# Patient Record
Sex: Female | Born: 1980 | Race: White | Hispanic: No | Marital: Single | Smoking: Current every day smoker
Health system: Southern US, Community
[De-identification: ages and names within clinical notes are randomized; demographics above are authoritative.]

---

## 2008-06-27 ENCOUNTER — Inpatient Hospital Stay: Payer: Self-pay

## 2009-05-22 ENCOUNTER — Emergency Department: Payer: Self-pay | Admitting: Emergency Medicine

## 2011-08-28 ENCOUNTER — Emergency Department: Payer: Self-pay | Admitting: Unknown Physician Specialty

## 2011-09-10 ENCOUNTER — Inpatient Hospital Stay: Payer: Self-pay | Admitting: Internal Medicine

## 2011-09-10 LAB — BASIC METABOLIC PANEL
BUN: 6 mg/dL — ABNORMAL LOW (ref 7–18)
Chloride: 108 mmol/L — ABNORMAL HIGH (ref 98–107)
Creatinine: 0.65 mg/dL (ref 0.60–1.30)
Glucose: 116 mg/dL — ABNORMAL HIGH (ref 65–99)
Osmolality: 276 (ref 275–301)
Potassium: 3.5 mmol/L (ref 3.5–5.1)
Sodium: 139 mmol/L (ref 136–145)

## 2011-09-10 LAB — CBC
HGB: 12.6 g/dL (ref 12.0–16.0)
MCH: 29.7 pg (ref 26.0–34.0)
MCHC: 33.6 g/dL (ref 32.0–36.0)
MCV: 88 fL (ref 80–100)
Platelet: 275 10*3/uL (ref 150–440)
RBC: 4.24 10*6/uL (ref 3.80–5.20)
WBC: 16.4 10*3/uL — ABNORMAL HIGH (ref 3.6–11.0)

## 2011-09-10 LAB — HCG, QUANTITATIVE, PREGNANCY: Beta Hcg, Quant.: <1 m[IU]/mL — ABNORMAL LOW

## 2011-09-10 LAB — TROPONIN I: Troponin-I: <0.02 ng/mL

## 2011-09-11 LAB — BASIC METABOLIC PANEL
Anion Gap: 10 (ref 7–16)
BUN: 8 mg/dL (ref 7–18)
Chloride: 108 mmol/L — ABNORMAL HIGH (ref 98–107)
Co2: 25 mmol/L (ref 21–32)
Creatinine: 0.62 mg/dL (ref 0.60–1.30)
EGFR (Non-African Amer.): >60
Osmolality: 283 (ref 275–301)
Potassium: 3.6 mmol/L (ref 3.5–5.1)
Sodium: 143 mmol/L (ref 136–145)

## 2011-09-11 LAB — CBC WITH DIFFERENTIAL/PLATELET
Eosinophil %: 1 %
HCT: 35.2 % (ref 35.0–47.0)
HGB: 11.6 g/dL — ABNORMAL LOW (ref 12.0–16.0)
Lymphocyte #: 3.5 10*3/uL (ref 1.0–3.6)
Lymphocyte %: 32.1 %
MCHC: 33 g/dL (ref 32.0–36.0)
MCV: 90 fL (ref 80–100)
Monocyte %: 4.2 %
Neutrophil #: 6.7 10*3/uL — ABNORMAL HIGH (ref 1.4–6.5)
Neutrophil %: 62.5 %
Platelet: 234 10*3/uL (ref 150–440)
RBC: 3.93 10*6/uL (ref 3.80–5.20)
WBC: 10.8 10*3/uL (ref 3.6–11.0)

## 2011-09-11 LAB — MAGNESIUM: Magnesium: 1.8 mg/dL

## 2011-09-15 LAB — CULTURE, BLOOD (SINGLE)

## 2011-09-16 ENCOUNTER — Inpatient Hospital Stay: Payer: Self-pay | Admitting: Internal Medicine

## 2011-09-16 LAB — CK TOTAL AND CKMB (NOT AT ARMC)
CK, Total: 57 U/L (ref 21–215)
CK-MB: <0.5 ng/mL — ABNORMAL LOW (ref 0.5–3.6)

## 2011-09-16 LAB — COMPREHENSIVE METABOLIC PANEL
Albumin: 3.4 g/dL (ref 3.4–5.0)
BUN: 8 mg/dL (ref 7–18)
Chloride: 103 mmol/L (ref 98–107)
Co2: 19 mmol/L — ABNORMAL LOW (ref 21–32)
Creatinine: 0.68 mg/dL (ref 0.60–1.30)
EGFR (African American): >60
EGFR (Non-African Amer.): >60
Glucose: 96 mg/dL (ref 65–99)
Osmolality: 266 (ref 275–301)
SGOT(AST): 17 U/L (ref 15–37)
SGPT (ALT): 43 U/L

## 2011-09-16 LAB — CBC
HCT: 40.9 % (ref 35.0–47.0)
MCH: 29.9 pg (ref 26.0–34.0)
MCHC: 34.2 g/dL (ref 32.0–36.0)
MCV: 87 fL (ref 80–100)
Platelet: 282 10*3/uL (ref 150–440)
RDW: 12.4 % (ref 11.5–14.5)
WBC: 22.5 10*3/uL — ABNORMAL HIGH (ref 3.6–11.0)

## 2011-09-16 LAB — PREGNANCY, URINE: Pregnancy Test, Urine: NEGATIVE m[IU]/mL

## 2011-09-17 LAB — CBC WITH DIFFERENTIAL/PLATELET
Basophil #: 0 10*3/uL (ref 0.0–0.1)
Eosinophil %: 0.2 %
HCT: 38.2 % (ref 35.0–47.0)
HGB: 13.3 g/dL (ref 12.0–16.0)
Lymphocyte #: 0.8 10*3/uL — ABNORMAL LOW (ref 1.0–3.6)
Lymphocyte %: 7.2 %
MCHC: 34.9 g/dL (ref 32.0–36.0)
MCV: 89 fL (ref 80–100)
Monocyte #: 0.1 10*3/uL (ref 0.0–0.7)
Monocyte %: 0.5 %
Neutrophil #: 10.3 10*3/uL — ABNORMAL HIGH (ref 1.4–6.5)
Neutrophil %: 91.8 %
RBC: 4.32 10*6/uL (ref 3.80–5.20)
WBC: 11.2 10*3/uL — ABNORMAL HIGH (ref 3.6–11.0)

## 2011-09-17 LAB — BASIC METABOLIC PANEL
Anion Gap: 11 (ref 7–16)
BUN: 10 mg/dL (ref 7–18)
Calcium, Total: 8.9 mg/dL (ref 8.5–10.1)
EGFR (African American): >60
EGFR (Non-African Amer.): >60
Glucose: 120 mg/dL — ABNORMAL HIGH (ref 65–99)
Osmolality: 280 (ref 275–301)
Potassium: 4.5 mmol/L (ref 3.5–5.1)

## 2011-09-17 LAB — SEDIMENTATION RATE: Erythrocyte Sed Rate: 39 mm/hr — ABNORMAL HIGH (ref 0–20)

## 2011-10-03 ENCOUNTER — Emergency Department: Payer: Self-pay | Admitting: Emergency Medicine

## 2011-10-11 ENCOUNTER — Emergency Department: Payer: Self-pay | Admitting: *Deleted

## 2011-10-11 LAB — CBC
MCH: 28.9 pg (ref 26.0–34.0)
MCHC: 34 g/dL (ref 32.0–36.0)
Platelet: 250 10*3/uL (ref 150–440)
RBC: 4.84 10*6/uL (ref 3.80–5.20)
RDW: 12.9 % (ref 11.5–14.5)

## 2011-10-11 LAB — COMPREHENSIVE METABOLIC PANEL
Albumin: 3.5 g/dL (ref 3.4–5.0)
Anion Gap: 12 (ref 7–16)
BUN: 12 mg/dL (ref 7–18)
Calcium, Total: 9.1 mg/dL (ref 8.5–10.1)
Chloride: 103 mmol/L (ref 98–107)
EGFR (African American): >60
Glucose: 92 mg/dL (ref 65–99)
Potassium: 3.5 mmol/L (ref 3.5–5.1)
SGOT(AST): 21 U/L (ref 15–37)
SGPT (ALT): 29 U/L
Total Protein: 8.5 g/dL — ABNORMAL HIGH (ref 6.4–8.2)

## 2012-12-12 IMAGING — CT CT CHEST W/ CM
1 series · 15 of 34 positions shown, 19 images · non-contrast
Comparison: none

REASON FOR EXAM: dyspnea, hypoxia, tachycardia, elevated D-dimer
COMMENTS:

[Series 4: soft tissue · axial · 0.70mm/px · z∈[-332,-71]mm · 15 of 103 slices shown, 19 images]
[im 8/103  mediastinal]
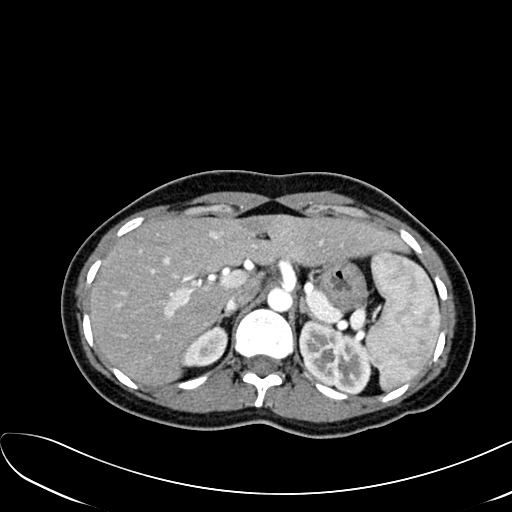
[im 8/103  lung]
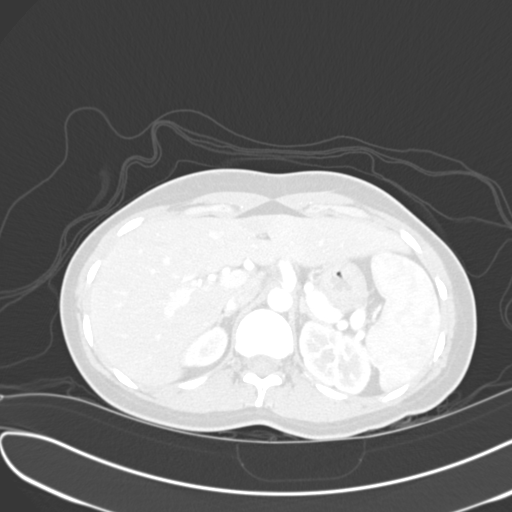
[im 16/103  lung]
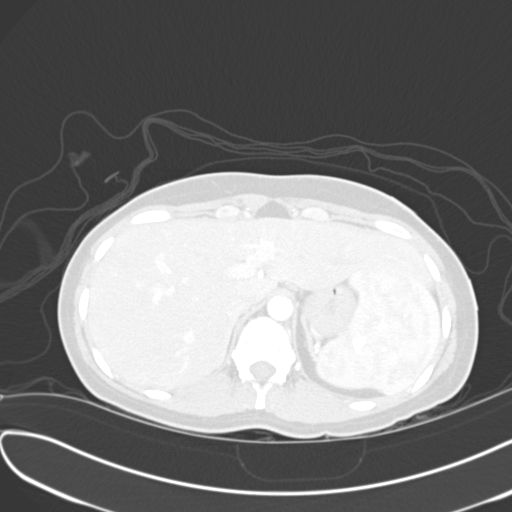
[im 21/103  lung]
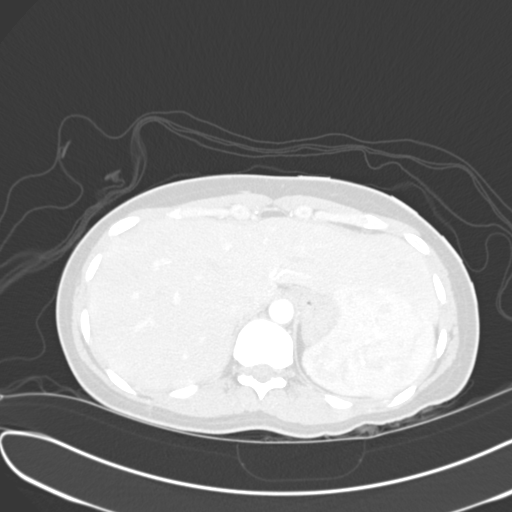
[im 27/103  lung]
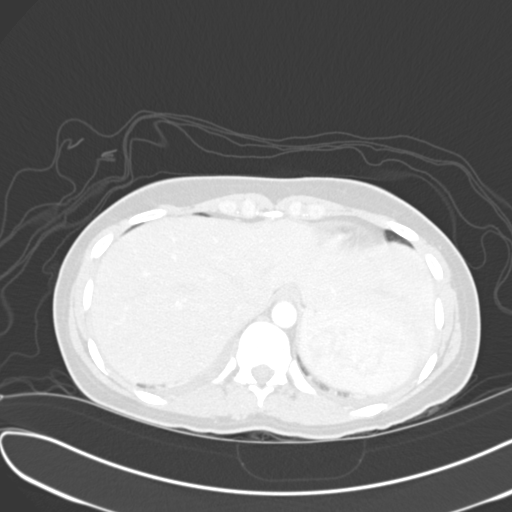
[im 35/103  mediastinal]
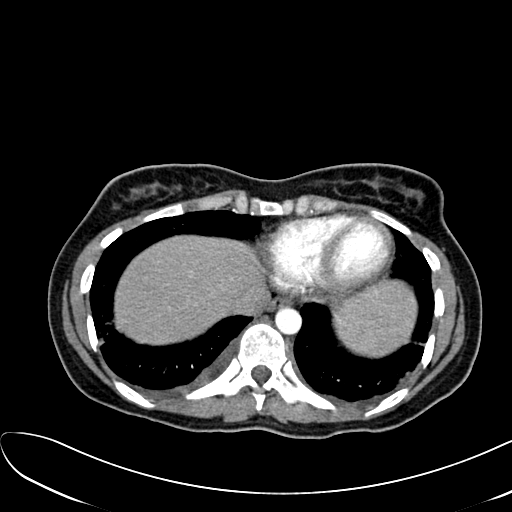
[im 35/103  lung]
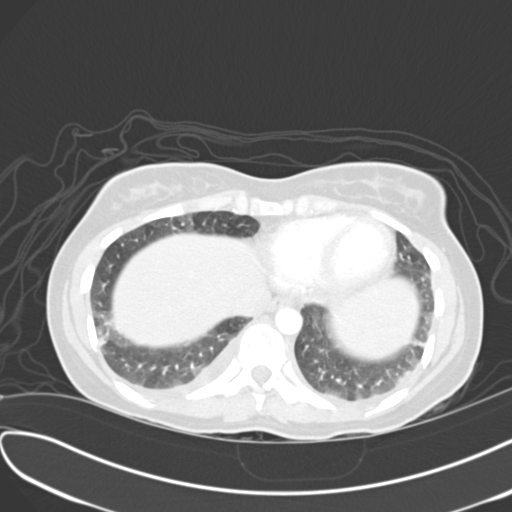
[im 41/103  lung]
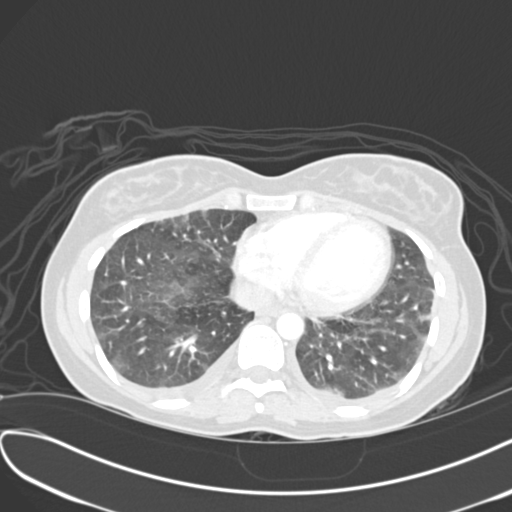
[im 46/103  lung]
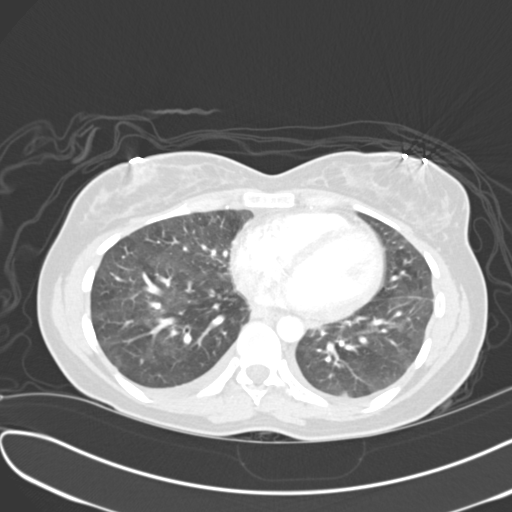
[im 53/103  lung]
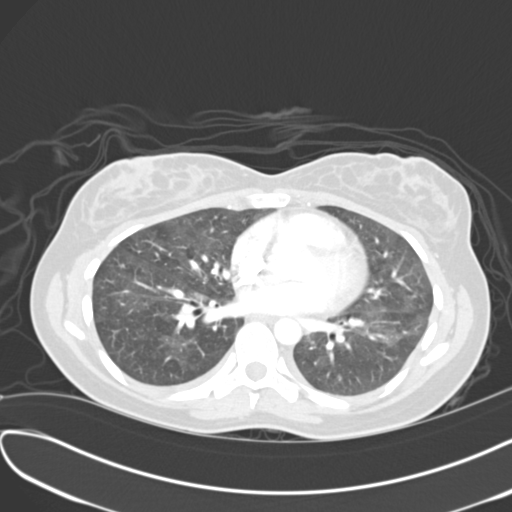
[im 57/103  mediastinal]
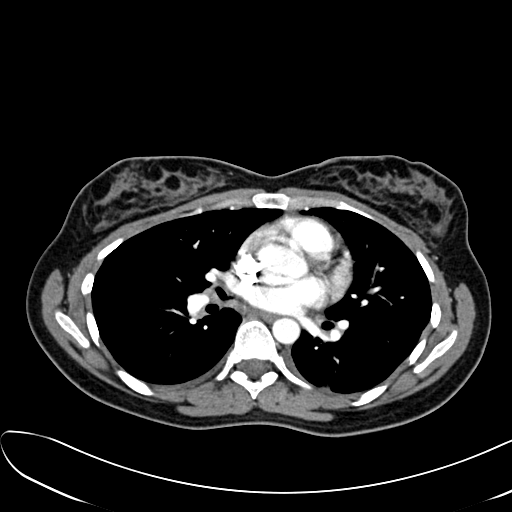
[im 57/103  lung]
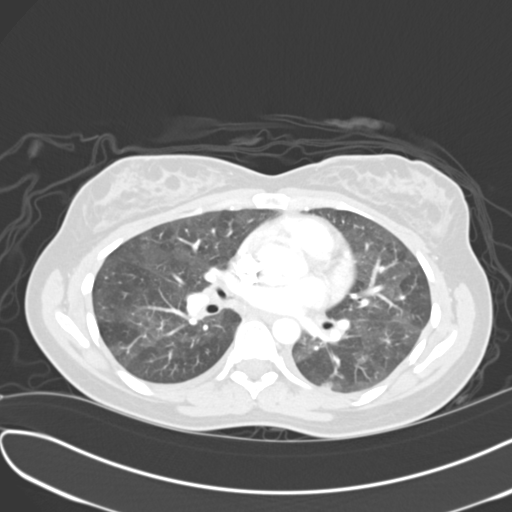
[im 62/103  lung]
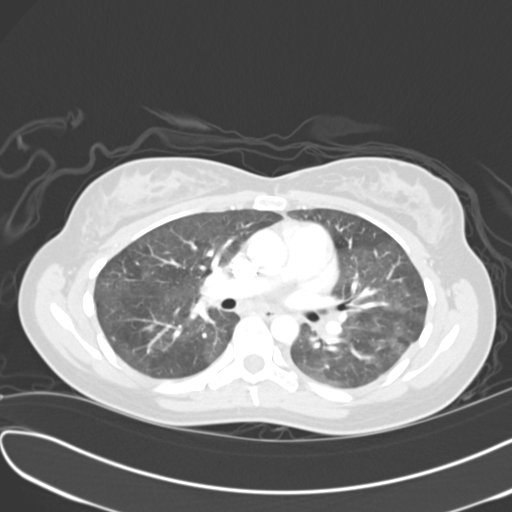
[im 69/103  lung]
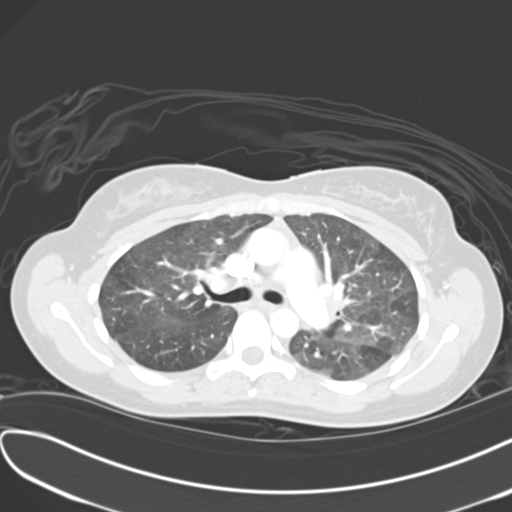
[im 76/103  lung]
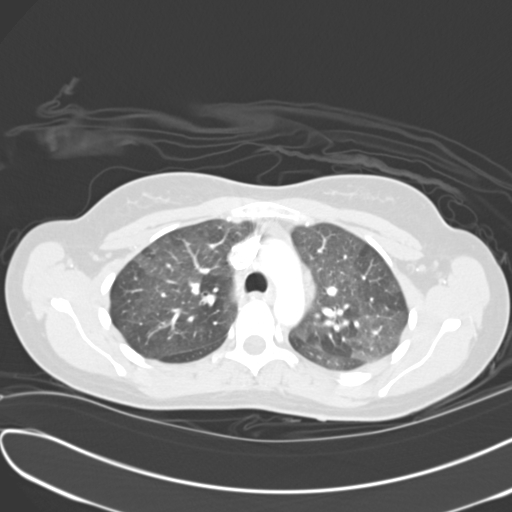
[im 82/103  mediastinal]
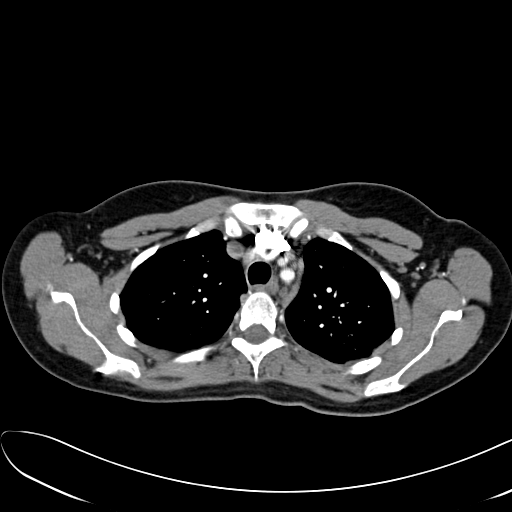
[im 82/103  lung]
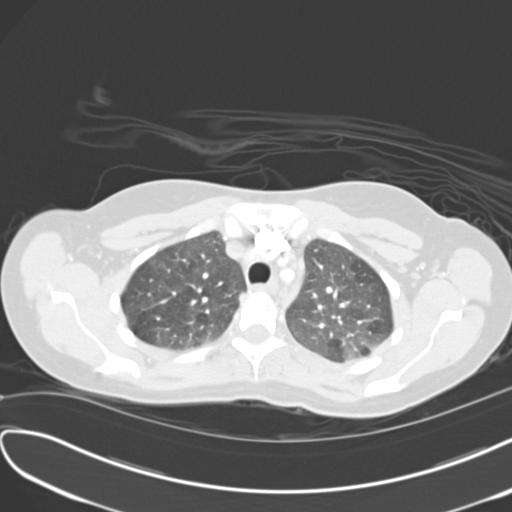
[im 87/103  lung]
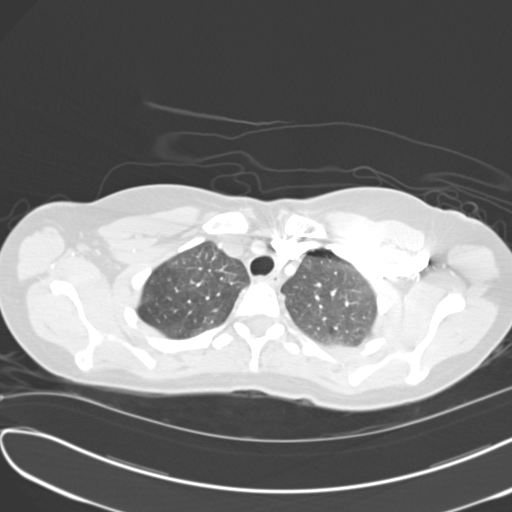
[im 95/103  lung]
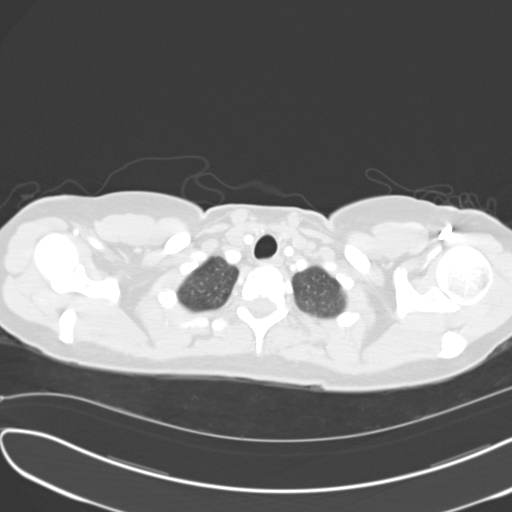

[15 of 34 positions shown; findings below may reference images not displayed]

PROCEDURE:     CT  - CT CHEST (FOR PE) W  - September 16, 2011 [DATE]

RESULT:     CT of the chest is performed utilizing 85 mL of Hsovue-VKT
iodinated intravenous contrast and oral contrast. Images are reconstructed
in the axial plane at 3.0 mm slice thickness. The patient has a previous
exam for from 10 September, 2011 for comparison.

No filling defect is seen within the pulmonary arteries to suggest pulmonary
embolism. Trace bilateral pleural effusions and bilateral lower lobe areas
of pneumonia versus atelectasis are present. There is progressive ground
glass attenuation in the lungs suggestive of pneumonitis. No endobronchial
lesion is evident. No discrete pulmonary parenchymal mass is appreciated.
Areas of subsegmental subpleural consolidation suggest minimal infiltrate
versus atelectasis in the lower lobes. Pulmonology consultation and followup
is recommended.

The thoracic aortic size is normal without evidence of dissection. No
pericardial effusion is seen. The upper abdominal structures appear within
normal limits for the phase of enhancement.
IMPRESSION: 1. Changes of pneumonitis with basilar pneumonia versus atelectasis. The
groundglass areas of attenuation have progressed since the previous study.
No evidence of pulmonary embolism. Trace pleural effusions. Pulmonology
consultation may be beneficial.

## 2013-01-07 IMAGING — CT CT CHEST W/ CM
1 series · 15 of 32 positions shown, 19 images · IV contrast (APPLIED)
Comparison: None

REASON FOR EXAM: tachycardia, hypoxia, recent hospital stay
COMMENTS:

PROCEDURE:     CT  - CT CHEST (FOR PE) W  - October 12, 2011  [DATE]
RESULT:     Indications: Shortness of breath
TECHNIQUE: A thin-section spiral CT from the lung apices to the upper
abdomen was acquired on a multi slice scanner following 100ml Wsovue-RAK
intravenous contrast. These images were then transferred to the Siemens work
station and were subsequently reviewed utilizing 3-D reconstructions and MIP
images.

[Series 4: soft tissue · axial · 0.60mm/px · z∈[-346,-64]mm · 15 of 105 slices shown, 19 images]
[im 7/105  soft-tissue]
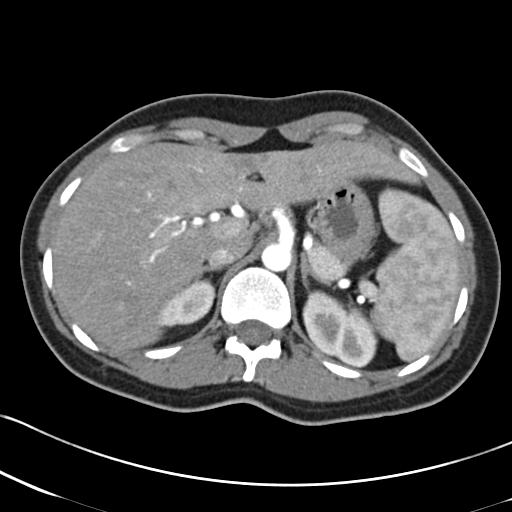
[im 7/105  bone]
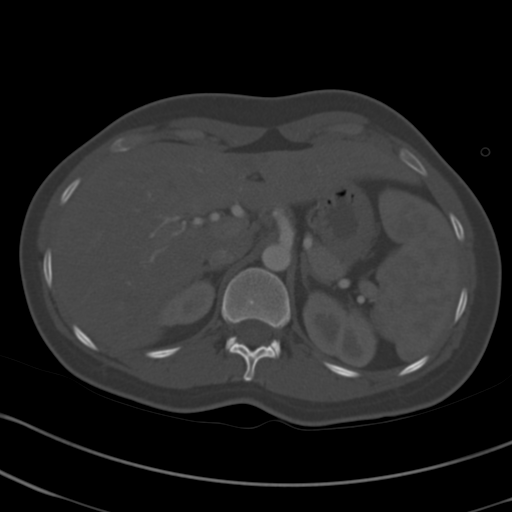
[im 14/105  soft-tissue]
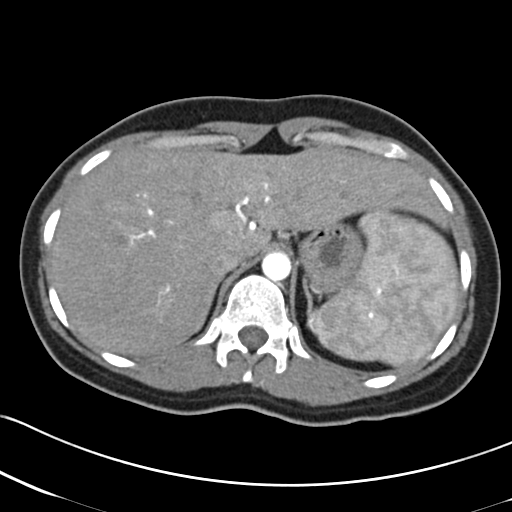
[im 21/105  soft-tissue]
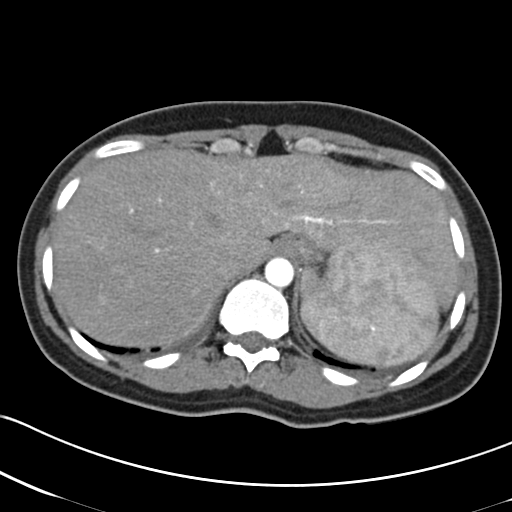
[im 31/105  soft-tissue]
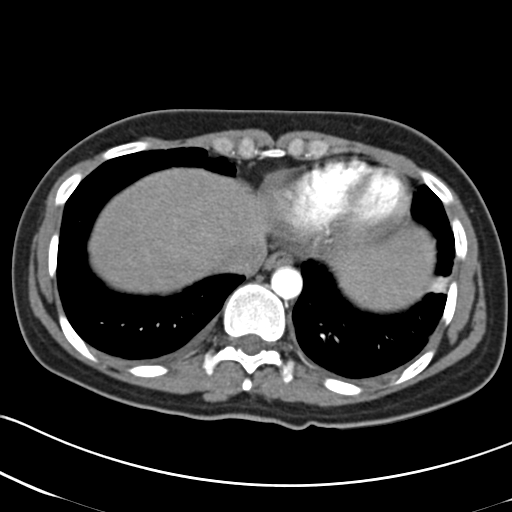
[im 37/105  soft-tissue]
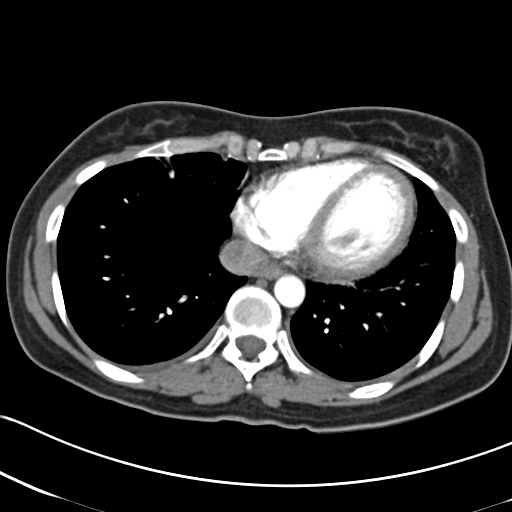
[im 44/105  soft-tissue]
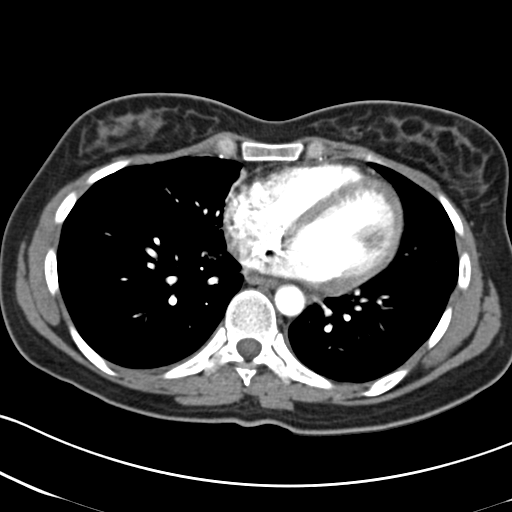
[im 54/105  soft-tissue]
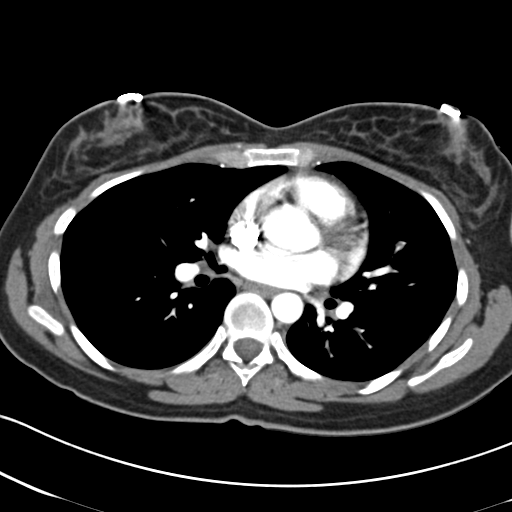
[im 61/105  soft-tissue]
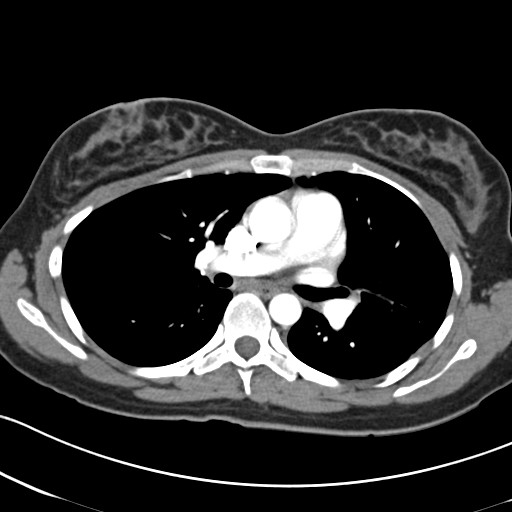
[im 68/105  soft-tissue]
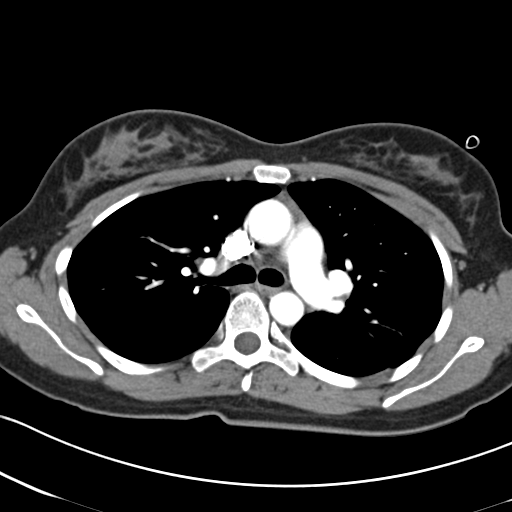
[im 68/105  bone]
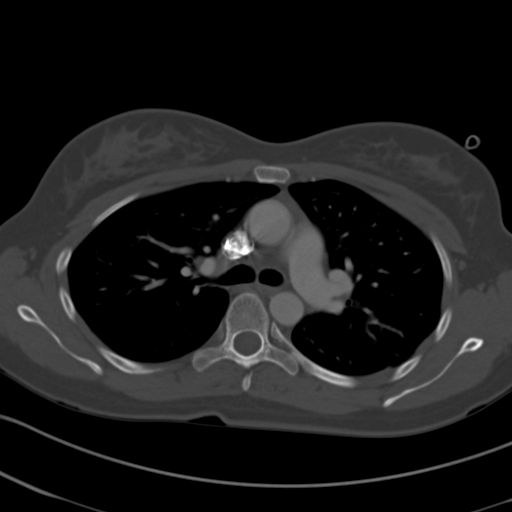
[im 74/105  soft-tissue]
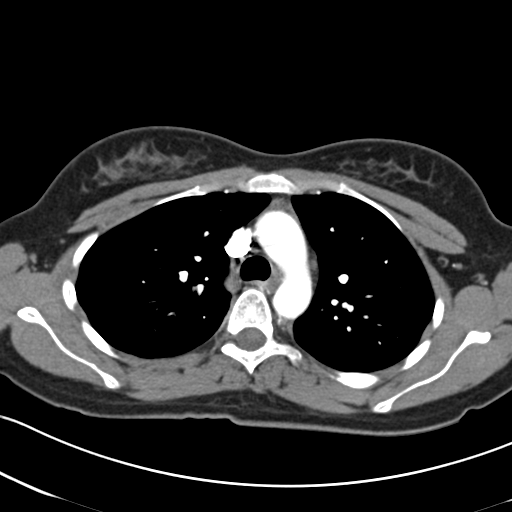
[im 84/105  soft-tissue]
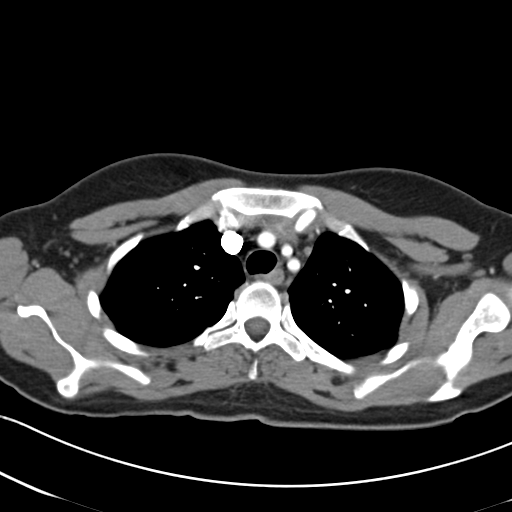
[im 91/105  soft-tissue]
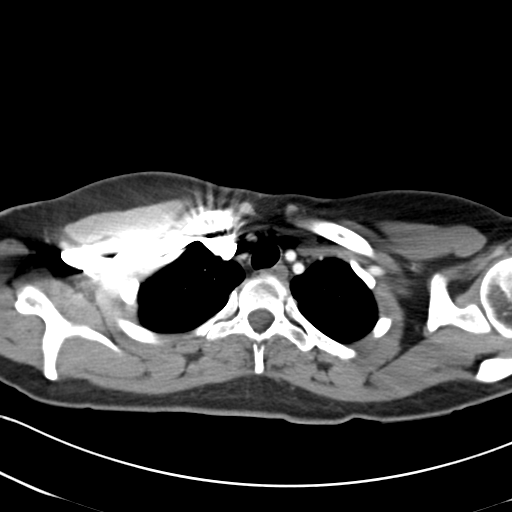
[im 91/105  lung]
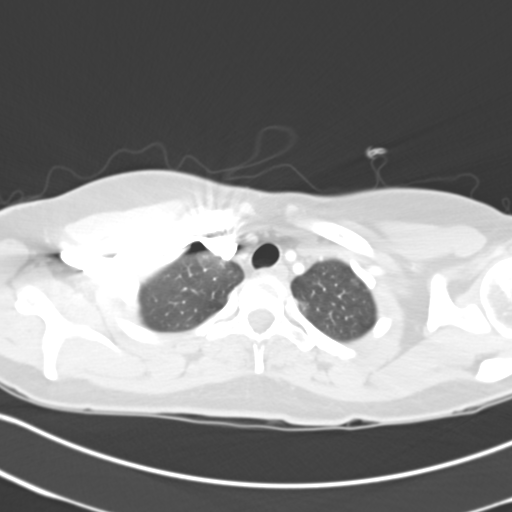
[im 94/105  lung]
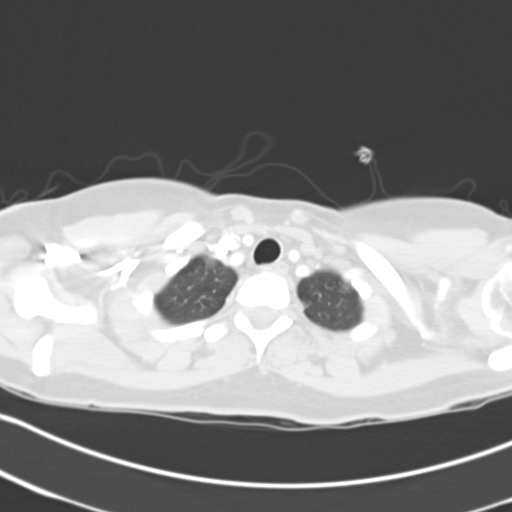
[im 98/105  soft-tissue]
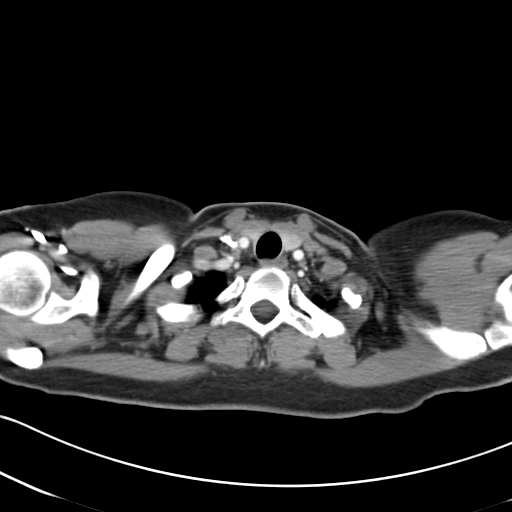
[im 98/105  lung]
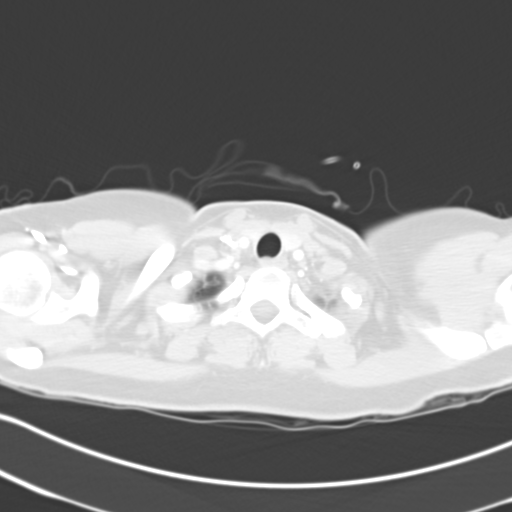
[im 101/105  lung]
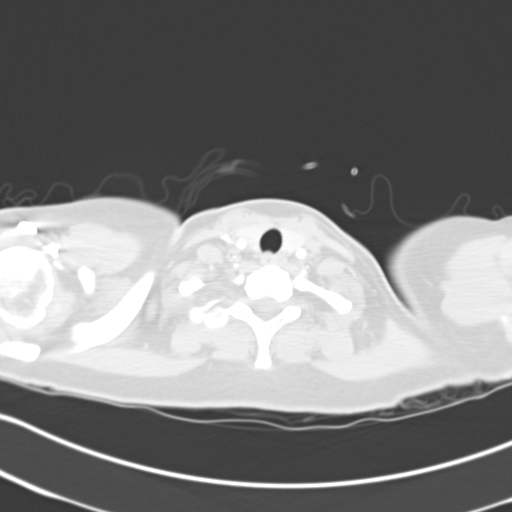

[15 of 32 positions shown; findings below may reference images not displayed]

FINDINGS: There is adequate opacification of the pulmonary arteries. There is no
pulmonary embolus. The main pulmonary artery, right main pulmonary artery,
and left main pulmonary arteries are normal in size. The heart size is
normal. There is no pericardial effusion.

There are patchy groundglass opacities in bilateral upper lobes and in
bilateral lower lobes which may be secondary to an infectious or
inflammatory etiology. Trace bilateral pleural effusions improved from the
prior exam. There is no focal consolidation. There is no pneumothorax.

There is no axillary, hilar, or mediastinal adenopathy.

The osseous structures are unremarkable.

The visualized portions of the upper abdomen are unremarkable.
IMPRESSION: 1. No CT evidence of pulmonary embolus.

2. There are patchy groundglass opacities in bilateral upper lobes and in
bilateral lower lobes which may be secondary to an infectious or
inflammatory etiology. The appearance is improved compared with 09/16/2011.

## 2013-03-13 ENCOUNTER — Emergency Department: Payer: Self-pay | Admitting: Emergency Medicine

## 2013-03-13 LAB — COMPREHENSIVE METABOLIC PANEL
Alkaline Phosphatase: 115 U/L (ref 50–136)
Anion Gap: 7 (ref 7–16)
BUN: 8 mg/dL (ref 7–18)
Calcium, Total: 8.9 mg/dL (ref 8.5–10.1)
EGFR (African American): >60
EGFR (Non-African Amer.): >60
SGOT(AST): 18 U/L (ref 15–37)
Sodium: 137 mmol/L (ref 136–145)
Total Protein: 8 g/dL (ref 6.4–8.2)

## 2013-03-13 LAB — URINALYSIS, COMPLETE
Bilirubin,UR: NEGATIVE
Ph: 6 (ref 4.5–8.0)
Protein: NEGATIVE
RBC,UR: 1 /HPF (ref 0–5)

## 2013-03-13 LAB — CBC
MCH: 29.9 pg (ref 26.0–34.0)
MCHC: 33.9 g/dL (ref 32.0–36.0)
Platelet: 239 10*3/uL (ref 150–440)
RDW: 13.8 % (ref 11.5–14.5)

## 2013-07-26 ENCOUNTER — Emergency Department: Payer: Self-pay | Admitting: Internal Medicine

## 2014-08-20 ENCOUNTER — Emergency Department: Payer: Self-pay | Admitting: Student

## 2014-12-17 ENCOUNTER — Encounter: Payer: Self-pay | Admitting: Obstetrics & Gynecology

## 2014-12-17 ENCOUNTER — Ambulatory Visit (INDEPENDENT_AMBULATORY_CARE_PROVIDER_SITE_OTHER): Payer: BLUE CROSS/BLUE SHIELD | Admitting: Obstetrics & Gynecology

## 2014-12-17 VITALS — BP 105/64 | HR 77 | Temp 98.3°F | Ht 69.0 in | Wt 146.7 lb

## 2014-12-17 DIAGNOSIS — Z30432 Encounter for removal of intrauterine contraceptive device: Secondary | ICD-10-CM

## 2014-12-17 NOTE — Progress Notes (Signed)
    GYNECOLOGY CLINIC PROCEDURE NOTE  Brittany LoosenBrandy Houston is a 34 y.o. [id number] here for Mirena IUD removal. No GYN concerns.    IUD Removal  Patient identified, informed consent performed, consent signed.  Patient was in the dorsal lithotomy position, normal external genitalia was noted.  A speculum was placed in the patient's vagina, normal discharge was noted, no lesions. The cervix was visualized, no lesions, no abnormal discharge.  The strings of the IUD were grasped and pulled using ring forceps.  The IUD was immediately noted to be embedded, and the strings of the IUD broke during the removal attempt.  The cervix was then cleaned with betadine and a tenaculum was placed on the anterior lip of the cervix.  Kelly forceps were introduced into the endometrial cavity and the copper IUD was grasped and removed in its entirety.  Patient tolerated the procedure well.    Patient plans for pregnancy soon and she was told to avoid teratogens, take PNV and folic acid.  Routine preventative health maintenance measures emphasized.   Jaynie CollinsUGONNA  Wayne Wicklund, MD, FACOG Attending Obstetrician & Gynecologist Center for [facility], [facility]

## 2014-12-17 NOTE — Patient Instructions (Signed)
Return to clinic for any scheduled appointments or for any gynecologic concerns as needed.   

## 2014-12-28 NOTE — Discharge Summary (Signed)
PATIENT NAME:  Brittany LoosenHURT, Undrea MR#:  [id number] DATE OF BIRTH:  1981-08-31  DATE OF ADMISSION:  09/16/2011 DATE OF DISCHARGE:  09/18/2011  DISCHARGE DIAGNOSES:  1. Recurrent pneumonia with hypoxemia. 2. History of smoking.  3. Possible systemic inflammatory response syndrome with leukocytosis, tachycardia, and hypoxia.  4. Leukocytosis.  5. Hyperglycemia. 6. Mild hyponatremia.   DISPOSITION: Patient is being discharged home.   FOLLOW UP: Follow up with [facility] as scheduled. Follow up with Dr. Freda MunroSaadat Khan in 1 to 2 weeks after discharge.   DIET: Regular.   ACTIVITY: As tolerated.   DISCHARGE MEDICATIONS:  1. Oxygen 2 liters via nasal cannula. 2. Augmentin 875 mg b.i.d. for seven days. 3. Zithromax 250 mg daily for three days.  4. Tussionex 5 mL b.i.d. p.r.n. cough.  5. Prednisone taper as prescribed. 6. Advair 100/50, 1 puff b.i.d.  7. Combivent 2 puffs q.6 hours p.r.n.  8. DuoNebs every six hours p.r.n.   CONSULTATION: Pulmonary consultation with Dr. Freda MunroSaadat Khan.  LABORATORY, DIAGNOSTIC AND RADIOLOGICAL DATA: CAT scan of the chest showed changes of pneumonitis with basilar pneumonia versus atelectasis, ground-glass areas of attenuation have progressed since her pervious study. No evidence of PE. Trace pleural effusion. ABG showed pH 7.45, pCO2 31, pO2 53 on room air. Bicarbonate 22.5 on admission, 11.2 by the time of discharge. Normal hemoglobin and platelet count. Serum glucose 120, sodium 134, rest of complete metabolic panel was normal.   HOSPITAL COURSE: Patient is a 34 year old female with history of smoking who was hospitalized for recurrent pneumonitis with SIRS. Patient was treated for pneumonia twice a year, once as an outpatient with Zithromax and the second time as an inpatient and discharged home on Levaquin. She came back to the hospital complaining of cough and shortness of breath. She was found to have possible systemic inflammatory response syndrome with  leukocytosis, tachycardia, and hypoxemia. Her CAT scan of the chest showed recurrent pneumonitis with progression of ground-glass opacities. Patient was started on empiric antibiotics and respiratory treatments and steroids. A pulmonary consultation with Dr. Freda MunroSaadat Khan was obtained. He recommended checking patient for Legionella antigen mycoplasma, ANA and ANCA. Over the next two days patient's condition continued to improve. She was taken off oxygen and had no desaturations at rest, however, on exertion patient's oxygen level dropped down into the 80s therefore home oxygen was arranged for the patient. Patient's case was discussed with Dr. Freda MunroSaadat Khan who recommended that since patient is stable she could be discharged home on a slow steroid taper and Augmentin and Zithromax. He will follow up and the reference labs in his office. Patient also has outpatient follow up at [facility] on 10/04/2011. Patient had mild hyperglycemia likely due to steroids and mild hyponatremia. Overall patient's condition has improved since admission and she is being discharged home in a stable condition.   TIME SPENT: 45 minutes.  ____________________________ Darrick MeigsSangeeta Sidnee Gambrill, MD sp:cms D: 09/18/2011 19:58:59 ET T: 09/19/2011 08:18:52 ET JOB#: [id number]  cc: Darrick MeigsSangeeta Reizel Calzada, MD, <Dictator> Darrick MeigsSANGEETA Zia Najera MD ELECTRONICALLY SIGNED 09/21/2011 16:25

## 2014-12-28 NOTE — Discharge Summary (Signed)
PATIENT NAME:  Brittany LoosenHURT, Brittany [place]#:  [id number] DATE OF BIRTH:  [birth year]  DATE OF ADMISSION:  09/10/2011 DATE OF DISCHARGE:  09/12/2011  ADMITTING PHYSICIAN:  Darrick MeigsSangeeta Panwar, MD DISCHARGING PHYSICIAN:  Enid Baasadhika Brevan Luberto, MD PRIMARY MD:  None.    CONSULTATIONS IN THE HOSPITAL: None.   DISCHARGE DIAGNOSES:  1. Right basilar pneumonia.  2. Reactive airway disease with chronic obstructive pulmonary disease.  3. Tobacco use disorder.     DISCHARGE MEDICATIONS:  1. Levaquin 500 mg p.o. daily until 09/20/2011.  2. Advair 100/50 one puff b.i.d.  3. Combivent inhaler 2 puffs q.6 hours p.r.n.  4. Albuterol 2.5 mg/Atrovent 0.5 mg 3 mL via nebulizers q.6 hours p.r.n.   DISCHARGE DIET: Regular diet.   DISCHARGE ACTIVITY: As tolerated.   FOLLOWUP INSTRUCTIONS:   1. Follow up with primary care physician in 1 to 2 weeks.  2. Smoking cessation.   LABS AND IMAGING STUDIES:  1. WBC 10.8, hemoglobin 11.6, hematocrit 35.2, platelet count 234,000.  2. Sodium 143, potassium 3.6, chloride 108, bicarbonate 25, BUN 8, creatinine 0.62, glucose of 89, calcium of 8.3, magnesium of 1.8.  3. Influenza A and B antigens negative. Blood cultures remained negative.  4. CT of the chest with contrast showing no evidence of pulmonary embolism, no evidence of thoracic aortic dissection, no aneurysm. Bilateral areas of developing pneumonia versus atelectasis and enlarged lymph nodes, likely acute infectious or inflammatory process. ABG showing pH 7.42, pCO2 of 34, pO2 of 60, bicarbonate of 22, and saturations of  91% on room air. D-dimer was normal at 0.38, and troponins were negative. Chest x-ray on admission was clear.  Echocardiogram showing trace tricuspid regurgitation, normal LV systolic function greater than 55% of EF, no pericardial effusion.   BRIEF HOSPITAL COURSE: Brittany Houston is a 34 year old Caucasian female with past medical history of smoking and possible reactive airway disease/COPD, comes to the hospital  complaining of pleuritic chest pain, dyspnea.  Her kits were tested positive for influenza A about a couple weeks ago, and then she started to feel sick. Her initial flu test was negative on admission, but this could be just false negative as the sensitivity of influenza test is only 69%.  A CT chest showed bibasilar infiltrates for which she was started on Levaquin and that clinically improved her symptoms.  She still feels a little tired but her chest pain and breathing have improved, and she has been walking around in the hospital without any difficulty. She is being discharged home on Levaquin antibiotic and will follow up.    Reactive airway disease and tobacco use disorder. She was on nicotine patch while in the hospital, states that has helped her. She is counseled about smoking cessation and how that is going to improve her lungs.  She said her boyfriend smokes at home so it is going to be a little tough for her but she said she is going to try.  She was started on Advair, Combivent inhalers while in the hospital, and she said she has a nebulizer machine at home so she was also given a prescription of Atrovent and albuterol nebulizers to use as needed for severe wheezing. Overall course has been otherwise uneventful.   DISCHARGE CONDITION: Stable.   DISCHARGE DISPOSITION: Home.   TIME SPENT ON DISCHARGE: 40 minutes.    ____________________________ Enid Baasadhika Willliam Pettet, MD rk:vtd D: 09/12/2011 15:42:54 ET T: 09/13/2011 15:44:55 ET JOB#: [id number]  cc: Enid Baasadhika Jontavious Commons, MD, <Dictator> Enid BaasADHIKA Tyrea Froberg MD ELECTRONICALLY SIGNED 09/19/2011 11:57

## 2014-12-28 NOTE — H&P (Signed)
PATIENT NAME:  Brittany Houston, Brittany Houston MR#:  [id number] DATE OF BIRTH:  13-Aug-1981  DATE OF ADMISSION:  09/10/2011  REFERRING PHYSICIAN:  ER physician, Dr Enedina FinnerGoli.    PRIMARY CARE PHYSICIAN: None.   CHIEF COMPLAINT: Cough, shortness of breath, pleuritic-type chest.   HISTORY OF PRESENT ILLNESS: The patient is a 34 year old female with no significant past medical history who felt sick about 2 weeks ago. She reports that she has 5 children and two of them had tested positive for influenza A. Both she and her boyfriend started feeling sick about the time that her children got sick. She came to the ER on 08/28/2011 and was diagnosed with bronchitis and discharged home on albuterol MDI, Z-Pak, and tramadol. The patient reports that in spite of completing the Z-Pak she continues to have cough for more than 1 month especially at night. At night she is unable to lie down.  She feels short of breath and suffocated when she tries to be lie down flat on her back and cannot breathe. She has been sleeping sitting up. She has been having on and off fever. Denies any chills or expectoration. Sometimes she coughs so much that she starts feeling nauseous.  She describes pleuritic-type chest pain in the center of her chest likely due to coughing.   PAST MEDICAL HISTORY:  None.   MEDICATIONS: The patient has finished a Z-Pak. She also was discharged from the ER on tramadol and albuterol MDI which she has been using.   PAST SURGICAL HISTORY: None.   FAMILY HISTORY: Mother has acid reflux.  Father has no medical problems. Grandmother has diabetes. Grandfather had lung cancer.   SOCIAL HISTORY: The patient used to smoke 1 pack per day for the past 10 years since she has been sick. She has been smoking only 5 to 6 cigarettes per day. Denies any alcohol or drug abuse. She lives with her boyfriend.   REVIEW OF SYSTEMS:  CONSTITUTIONAL: The patient reports weakness and fatigue. Denies any unusual weight changes, on and off fever. EYES:  Denies any blurred or double vision. ENT: Denies any tinnitus or ear pain. RESPIRATORY: Reports painful respiration, cough, dyspnea. CARDIOVASCULAR: Denies any tachycardia, palpitations, edema. GI: Reports some nausea. No vomiting, diarrhea, abdominal pain. GU: Denies any nocturia, polyuria, hematuria. MUSCULOSKELETAL: Denies any joint pain, stiffness or arthritis. INTEGUMENTARY:  She denies any eruptions or rashes.  NEUROLOGICAL: Denies any fainting, blackouts, seizures. PSYCHIATRIC: Denies any insomnia, headache, depression.  ENDOCRINE: Denies any thyroid problems, heat or cold intolerance. HEME/LYMPH: Denies any anemia or easy bruisability.   PHYSICAL EXAMINATION:  VITAL SIGNS: Temperature 98.8, heart rate 98, respiratory rate 18, blood pressure 12/74, pulse oximetry 97%.   GENERAL: The patient is a young Caucasian female lying in bed. She is having intermittent coughing.   HEAD: Atraumatic, normocephalic.   EYES: No pallor, icterus, or cyanosis. PERRLA, EOMI.     ENT: Wet mucous membranes. No oropharyngeal erythema or thrush.   NECK: Supple. No masses. No JVD. No thyromegaly.   CHEST WALL: No tenderness to palpation. Not using accessory muscles of respiration. No intercostal retractions.   LUNGS: The patient has bilateral basal crepitations and reduced breath sounds especially in the right lung base. No wheezing or rhonchi.   CVS:  S1 and S2 regular.  No murmur, rubs, or gallops.   ABDOMEN: Soft, nontender, nondistended. No guarding. No rigidity. No organomegaly. Normal bowel sounds.   SKIN: No rashes or lesions.   PERIPHERIES: No pedal edema; 2+ pedal pulses.  MUSCULOSKELETAL: No cyanosis or clubbing.   NEUROLOGICAL: Nonfocal neurological exam.  Cranial nerves grossly intact.   PSYCHIATRIC: Awake, alert, oriented x3.  Normal mood and affect.    CAT scan of the chest shows bilateral lower lobe areas of developing pneumonia versus atelectasis and pretracheal borderline enlarged  lymph nodes, no evidence of thoracic aortic aneurysm dissection. No evidence of pulmonary PE. ABG showed pH normal, pCO2 of 34, pO2 of 60, glucose 116, BUN 6, creatinine 0.65, sodium 139, potassium 2.5, chloride 108, CO2 of 21, calcium 8.3, anion gap 10.  Median incision less than 2.  White count 16.4, hemoglobin 12.6, hematocrit 37.5. Platelet count 235,000.   ASSESSMENT AND PLAN: A 34 year old female with no significant problem who had recent sick contact with children who were sick with influenza, and presents with ongoing cough, shortness of breath, pleuritic type chest pain.  1. Bilateral lower lobe pneumonia. CAT scan of the chest shows bilateral developing lower lobe pneumonia with his atelectasis.  We will obtain blood cultures, start patient on empiric antibiotics, IV fluids, empiric cough syrup.  We will also place on respiratory treatments.   2. Hypoxemia, likely due to bilateral pneumonia. We will provide with supplemental oxygen.  3. Hyperglycemia, likely reactive. The patient has no history of diabetes.  4. Leukocytosis. This was likely related to underlying infection.        5. Smoking.  he patient was counseled about cessation for more than 3 minutes.  6. We will place on gastrointestinal deep venous thrombosis prophylaxis.   7. Reviewed all medical records, discussed with the patient and her boyfriend the plan of care and management   TIME SPENT: 75 minutes   ____________________________ Darrick Meigs, MD sp:vtd D: 09/10/2011 12:34:06 ET T: 09/10/2011 12:51:35 ET JOB#: [id number] cc: Darrick Meigs, MD, <Dictator> Darrick Meigs MD ELECTRONICALLY SIGNED 09/11/2011 13:18

## 2014-12-28 NOTE — H&P (Signed)
PATIENT NAME:  Brittany Houston, Brittany Houston MR#:  [id number] DATE OF BIRTH:  1980/09/15  DATE OF ADMISSION:  09/16/2011  PRIMARY DOCTOR: [facility]   ER PHYSICIAN: Dr. Margarita Houston   CHIEF COMPLAINT: Trouble breathing.   HISTORY OF PRESENT ILLNESS: The patient is a 34 year old female who was recently discharged on the 7th after suffering pneumonia, went home with antibiotics and Advair. She started to feel better yesterday but since last night she could not breathe properly and has a lot of cough. Because of worsening trouble breathing, the patient came to the Emergency Room and she was found to be tachycardic and hypoxic with saturations of 90% on room air. CT chest showed pneumonitis, ground-glass areas of attenuation which is worse, and also hypoxia with pO2 of 53 on ABG. The patient is seen at the bedside. She says that trouble breathing is worse since last night associated with some cough. She is unable to sleep and because of that she came. She has some chest pain because of coughing. Denies any fever.   PAST MEDICAL HISTORY: Recent hospitalization for pneumonia.   MEDICATIONS: The patient was discharged on Levaquin and Advair on January 7th. She was also discharged with Combivent and albuterol.   ALLERGIES: No known allergies.   SOCIAL HISTORY: She used to smoke 1 pack per day, last smoked a week ago. No alcohol. No drugs. Lives with her boyfriend.   PAST SURGICAL HISTORY: None.   FAMILY HISTORY: Significant for mother having reflux. Grandfather had lung cancer. Father had no complaints.   REVIEW OF SYSTEMS: CONSTITUTIONAL: Feels fatigued, some anxiety. EYES: No blurred vision. ENT: No tinnitus. No ear pain. No epistaxis. No difficulty swallowing. RESPIRATORY: Cough, wheezing, and trouble breathing. CARDIOVASCULAR: Has pleuritic chest pain. No orthopnea. No PND. No palpitations. GI: No nausea. No vomiting. No abdominal pain. GU: No dysuria or hematuria. ENDOCRINE: No polyuria or nocturia.  MUSCULOSKELETAL: No joint pain. NEUROLOGIC: No numbness. No weakness. PSYCH: No anxiety or insomnia.   PHYSICAL EXAMINATION:   VITAL SIGNS: Temperature 98.6, pulse 139, respiratory rate 140 when she came, blood pressure 120/82. Right now she is on 4 liters saturating at 97%. Saturations were 90% on room air when she came in.   GENERAL: Alert, awake, oriented, somewhat anxious and breathing fast. Says that because of her anxiety she has been breathing fast.   HEAD: Atraumatic and normocephalic.    EYES: Pupils are equal and reacting to light. Extraocular movements are intact.   ENT: No tympanic membrane congestion. No turbinate hypertrophy. No oropharyngeal erythema.   NECK: Normal range of motion. No JVD. No carotid bruits.   CARDIOVASCULAR: S1, S2 regular, tachycardic.   LUNGS: The patient has mild expiratory wheeze in all lung fields and also overall diminished breath sounds.   ABDOMEN: Soft, nontender, nondistended. Bowel sounds present.   EXTREMITIES: No extremity edema. No cyanosis. No clubbing.   SKIN: Warm and dry.   NEUROLOGIC: The patient has no focal neurological deficit.   PSYCH: Mood and affect are within normal limits, slightly anxious.   LABORATORY, DIAGNOSTIC, AND RADIOLOGICAL DATA: ABG pH 7.45, pCO2 31, pO2 53, FiO2 21, saturations 88.7 on room air. Chest CAT scan showed pneumonitis with basilar pneumonia versus atelectasis and also ground-glass areas of attenuation that have progressed since previous study. No evidence of PE. Trace pleural effusion. D-dimer 0.65. BNP 16. CK total 57. CPK-MB less than 0.5. WBC 22.5, hemoglobin 14, hematocrit 40.9, platelets 289. Electrolytes sodium 134, potassium 3.6, chloride 103, bicarb 19, BUN 8, creatinine  0.68, glucose 96. Troponin less than 0.02. EKG showed sinus tach at 138 beats per minute.   ASSESSMENT AND PLAN:  1. This is a 34 year old female with hypoxic failure. The patient's sats were 90 on room air. She is using 3 to 4  liters of oxygen. Admit the patient to hospitalist service for hypoxic respiratory failure due to bilateral pneumonia. Change antibiotics to vancomycin and Zosyn because of recent hospitalization to cover for MRSA. Follow blood cultures. The patient has evidence of SIRS with elevated white count, tachycardia, and also bicarb is slightly low and hypoxia on ABG. She is going to need IV fluids, normal saline at 80 mL/h and check blood cultures and follow CBC and BMP tomorrow. The patient will be started on IV steroids along with nebulizers. Request Pulmonary consult because of recurrent pneumonia.  2. GI prophylaxis and DVT prophylaxis.  TIME SPENT ON PATIENT CARE: About 60 minutes.   Discussed the plan with patient's boyfriend and patient at bedside.   ____________________________ Brittany HammingSnehalatha Lorina Duffner, MD sk:drc D: 09/16/2011 12:10:23 ET T: 09/16/2011 12:35:40 ET JOB#: [id number]  cc: Brittany HammingSnehalatha Marimar Suber, MD, <Dictator> [facility] Brittany HammingSNEHALATHA Ethelbert Thain MD ELECTRONICALLY SIGNED 09/18/2011 16:27

## 2019-12-05 ENCOUNTER — Ambulatory Visit: Payer: Self-pay | Attending: Internal Medicine

## 2019-12-05 DIAGNOSIS — Z23 Encounter for immunization: Secondary | ICD-10-CM

## 2019-12-05 NOTE — Progress Notes (Signed)
   Covid-19 Vaccination Clinic  Name:  Bellamia Ferch    MRN: [id number] DOB: 25-Mar-1981  12/05/2019  Ms. Radney was observed post Covid-19 immunization for 15 minutes without incident. She was provided with Vaccine Information Sheet and instruction to access the V-Safe system.   Ms. Newmann was instructed to call 911 with any severe reactions post vaccine: Marland Kitchen Difficulty breathing  . Swelling of face and throat  . A fast heartbeat  . A bad rash all over body  . Dizziness and weakness   Immunizations Administered    Name Date Dose VIS Date Route   Pfizer COVID-19 Vaccine 12/05/2019  4:27 PM 0.3 mL 08/16/2019 Intramuscular   Manufacturer: [employer], [employer]   Lot: [id number]   NDC: [address]-3

## 2019-12-30 ENCOUNTER — Ambulatory Visit: Payer: BC Managed Care – PPO | Attending: Internal Medicine

## 2019-12-30 DIAGNOSIS — Z23 Encounter for immunization: Secondary | ICD-10-CM

## 2019-12-30 NOTE — Progress Notes (Signed)
   Covid-19 Vaccination Clinic  Name:  Brittany Houston    MRN: [id number] DOB: 04/06/1981  12/30/2019  [name] was observed post Covid-19 immunization for 15 minutes without incident. She was provided with Vaccine Information Sheet and instruction to access the V-Safe system.   [name] was instructed to call 911 with any severe reactions post vaccine: Marland Kitchen Difficulty breathing  . Swelling of face and throat  . A fast heartbeat  . A bad rash all over body  . Dizziness and weakness   Immunizations Administered    Name Date Dose VIS Date Route   Pfizer COVID-19 Vaccine 12/30/2019 11:15 AM 0.3 mL 10/30/2018 Intramuscular   Manufacturer: [employer], [employer]   Lot: [id number]   NDC: [address]-3

## 2021-03-24 ENCOUNTER — Other Ambulatory Visit: Payer: Self-pay

## 2021-03-24 ENCOUNTER — Ambulatory Visit (INDEPENDENT_AMBULATORY_CARE_PROVIDER_SITE_OTHER): Payer: BC Managed Care – PPO

## 2021-03-24 ENCOUNTER — Ambulatory Visit
Admission: EM | Admit: 2021-03-24 | Discharge: 2021-03-24 | Disposition: A | Discharge status: Home / Self Care | Payer: BC Managed Care – PPO | Attending: Emergency Medicine | Admitting: Emergency Medicine

## 2021-03-24 ENCOUNTER — Encounter: Payer: Self-pay | Admitting: Emergency Medicine

## 2021-03-24 DIAGNOSIS — M25561 Pain in right knee: Secondary | ICD-10-CM | POA: Diagnosis not present

## 2021-03-24 DIAGNOSIS — S83411A Sprain of medial collateral ligament of right knee, initial encounter: Secondary | ICD-10-CM | POA: Diagnosis not present

## 2021-03-24 MED ORDER — HYDROCODONE-ACETAMINOPHEN 5-325 MG PO TABS: 1.0000 | ORAL_TABLET | Freq: Four times a day (QID) | ORAL | 0 refills | Status: AC | PRN

## 2021-03-24 MED ORDER — IBUPROFEN 600 MG PO TABS: 600.0000 mg | ORAL_TABLET | Freq: Four times a day (QID) | ORAL | 0 refills | Status: AC | PRN

## 2021-03-24 NOTE — ED Triage Notes (Signed)
Pt presents today with c/o of right knee pain. She reports that she was injured while at work today when three cases of hamburger patties fell against her right leg. She is ambulatory with a limp.

## 2021-03-24 NOTE — ED Provider Notes (Signed)
[facility]    CSN: [id number] Arrival date & time: 03/24/21  1901      History   Chief Complaint Chief Complaint  Patient presents with   Knee Pain    right    HPI Brittany Houston is a 40 y.o. female.   HPI  40 year old female here for evaluation of right knee pain.  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  She states that it caused her knee to bend inwards, she felt a pop, and then her knee straightened back out.  She has had pain on the inside of her knee, numbness and tingling in her foot, and has been able to bear weight with a limp since then.  Patient states that she is finalized under her own insurance rather than Worker's Comp. at this time.  History reviewed. No pertinent past medical history.  There are no problems to display for this patient.   History reviewed. No pertinent surgical history.  OB History     Gravida  2   Para  2   Term  2   Preterm      AB      Living  2      SAB      IAB      Ectopic      Multiple      Live Births  2            Home Medications    Prior to Admission medications   Medication Sig Start Date End Date Taking? Authorizing Provider  HYDROcodone-acetaminophen (NORCO/VICODIN) 5-325 MG tablet Take 1-2 tablets by mouth every 6 (six) hours as needed. 03/24/21  Yes Becky Augusta, NP  ibuprofen (ADVIL) 600 MG tablet Take 1 tablet (600 mg total) by mouth every 6 (six) hours as needed. 03/24/21  Yes Becky Augusta, NP    Family History History reviewed. No pertinent family history.  Social History Social History   Tobacco Use   Smoking status: Every Day    Packs/day: 1.00    Years: 10.00    Pack years: 10.00    Types: Cigarettes   Smokeless tobacco: Never  Vaping Use   Vaping Use: Never used  Substance Use Topics   Alcohol use: Not Currently   Drug use: Not Currently      Allergies   Patient has no known allergies.   Review of Systems Review of Systems  Musculoskeletal:  Positive for arthralgias and joint swelling.  Skin:  Negative for color change.  Neurological:  Positive for numbness. Negative for weakness.  Hematological: Negative.     Physical Exam Triage Vital Signs ED Triage Vitals  Enc Vitals Group     BP 03/24/21 2013 119/76     Pulse Rate 03/24/21 2013 76     Resp 03/24/21 2013 18     Temp 03/24/21 2013 98.2 F (36.8 C)     Temp Source 03/24/21 2013 Oral     SpO2 03/24/21 2013 100 %     Weight --      Height --      Head Circumference --      Peak Flow --      Pain Score 03/24/21 2011 7     Pain Loc --      Pain Edu? --      Excl. in GC? --    No  data found.  Updated Vital Signs BP 119/76 (BP Location: Left Arm)   Pulse 76   Temp 98.2 F (36.8 C) (Oral)   Resp 18   LMP 03/17/2021   SpO2 100%   Visual Acuity Right Eye Distance:   Left Eye Distance:   Bilateral Distance:    Right Eye Near:   Left Eye Near:    Bilateral Near:     Physical Exam Vitals and nursing note reviewed.  Constitutional:      General: She is not in acute distress.    Appearance: Normal appearance. She is normal weight. She is ill-appearing.  Musculoskeletal:        General: Swelling and tenderness present. No deformity or signs of injury.  Skin:    General: Skin is warm and dry.     Capillary Refill: Capillary refill takes less than 2 seconds.     Findings: No bruising or erythema.  Neurological:     General: No focal deficit present.     Mental Status: She is alert and oriented to person, place, and time.     Sensory: No sensory deficit.     Motor: No weakness.  Psychiatric:        Mood and Affect: Mood normal.        Behavior: Behavior normal.        Thought Content: Thought content normal.        Judgment: Judgment normal.     UC Treatments / Results  Labs (all labs ordered are listed, but only abnormal results are  displayed) Labs Reviewed - No data to display  EKG   Radiology DG Knee Complete 4 Views Right  Result Date: 03/24/2021 CLINICAL DATA:  Pain and swelling.  Injury at work today. EXAM: RIGHT KNEE - COMPLETE 4+ VIEW COMPARISON:  None. FINDINGS: No evidence of fracture, dislocation, or joint effusion. Normal joint spaces and alignment. No evidence of arthropathy or other focal bone abnormality. Soft tissues are unremarkable. IMPRESSION: No fracture or subluxation of the right knee. Electronically Signed   By: Narda Rutherford M.D.   On: 03/24/2021 20:45    Procedures Procedures (including critical care time)  Medications Ordered in UC Medications - No data to display  Initial Impression / Assessment and Plan / UC Course  I have reviewed the triage vital signs and the nursing notes.  Pertinent labs & imaging results that were available during my care of the patient were reviewed by me and considered in my medical decision making (see chart for details).  Patient is a very pleasant 40 year old female here for evaluation of right knee pain that started today after she was struck on the lateral aspect of her knee forcing it medially by a stack of frozen hamburger patty boxes.  She states that her knee bent in, she heard a pop, and then when the boxes fell away it pop back into normal position.  She has been to bear weight but she had continuing pain on the inner aspect of her right knee.  She states when she sits with it semiflexed is not as painful as when she has it fully flexed or fully extended.  She is complaining of some numbness and tingling in her right foot as well.  There is no ecchymosis or erythema present but there is edema to the medial aspect of the right knee.  She has tenderness with palpation over the medial collateral ligament.  She has no pain with palpation of the quadriceps complex, patella,  lateral joint line, popliteal space, or tibial tuberosity.  She does have tenderness with  palpation of the medial joint line.  DP and PT pulses are 2+.  Patient has increasing pain when passively brought to full extension.  She also has pain to the medial aspect of the right knee with varus stress application there is also laxity present in the knee joint with varus stress application.  We will obtain radiograph of the right knee.  Radiology trepidation right knee as it is negative for fracture or subluxation.  Will treat patient for sprain of the medial collateral ligament with crutches, hinged knee brace, and elevation.  I will have patient follow-up with [facility].  Work note provided to take patient out of work for the remainder of the week.  We will have patient use ibuprofen 600 mg every 6 hours as needed for mild to moderate pain and will give a short course of Norco as needed for nighttime and severe pain.  Work note provided.   Final Clinical Impressions(s) / UC Diagnoses   Final diagnoses:  Sprain of medial collateral ligament of right knee, initial encounter   Discharge Instructions   None    ED Prescriptions     Medication Sig Dispense Auth. Provider   ibuprofen (ADVIL) 600 MG tablet Take 1 tablet (600 mg total) by mouth every 6 (six) hours as needed. 30 tablet Becky Augusta, NP   HYDROcodone-acetaminophen (NORCO/VICODIN) 5-325 MG tablet Take 1-2 tablets by mouth every 6 (six) hours as needed. 10 tablet Becky Augusta, NP      I have reviewed the PDMP during this encounter.   Becky Augusta, NP 03/24/21 2105

## 2021-03-24 NOTE — Discharge Instructions (Addendum)
Your x-rays today did not demonstrate any broken bones.  Wear the hinged knee brace to support your knee and prevent further injury.  This will help keep it in normal anatomical alignment.  Use the crutches to assist you in bearing weight.  You may bear weight as tolerated.  The crutches at there to give you more stability.  Keep your right knee elevated is much as possible to help decrease swelling and aid in wound healing.  The left swelling that exists the bed of the blood flow is to the knee and the sooner you heal.  Apply ice to your knee for 20 minutes at a time 2-3 times a day for the first 48 hours and then after that apply moist heat for 20 minutes at a time 2-3 times a day.  If your knee pain does not improve in 2 to 4 weeks, or worsens, I recommend you follow-up with [facility] in [place].
